# Patient Record
Sex: Female | Born: 1950 | Race: White | Hispanic: No | Marital: Married
Health system: Southern US, Community
[De-identification: ages and names within clinical notes are randomized; demographics above are authoritative.]

---

## 2001-12-26 ENCOUNTER — Other Ambulatory Visit: Admission: RE | Admit: 2001-12-26 | Discharge: 2001-12-26 | Payer: Self-pay | Admitting: Family Medicine

## 2007-06-10 ENCOUNTER — Other Ambulatory Visit: Admission: RE | Admit: 2007-06-10 | Discharge: 2007-06-10 | Payer: Self-pay | Admitting: Obstetrics and Gynecology

## 2009-04-01 ENCOUNTER — Other Ambulatory Visit: Admission: RE | Admit: 2009-04-01 | Discharge: 2009-04-01 | Payer: Self-pay | Admitting: Family Medicine

## 2013-09-05 ENCOUNTER — Other Ambulatory Visit (HOSPITAL_COMMUNITY): Payer: Self-pay | Admitting: Family Medicine

## 2013-09-05 ENCOUNTER — Ambulatory Visit (HOSPITAL_COMMUNITY)
Admission: RE | Admit: 2013-09-05 | Discharge: 2013-09-05 | Disposition: A | Discharge status: Home / Self Care | Payer: BC Managed Care – PPO | Source: Ambulatory Visit | Attending: Family Medicine | Admitting: Family Medicine

## 2013-09-05 DIAGNOSIS — M7989 Other specified soft tissue disorders: Secondary | ICD-10-CM

## 2013-09-05 DIAGNOSIS — M25562 Pain in left knee: Secondary | ICD-10-CM

## 2013-09-05 NOTE — Progress Notes (Addendum)
Left lower extremity venous duplex completed.  Left:  No evidence of DVT, superficial thrombosis, or Baker's cyst.  Right:  Negative for DVT in the common femoral vein.  

## 2013-10-23 ENCOUNTER — Other Ambulatory Visit: Payer: Self-pay | Admitting: Family Medicine

## 2013-10-23 ENCOUNTER — Ambulatory Visit
Admission: RE | Admit: 2013-10-23 | Discharge: 2013-10-23 | Disposition: A | Discharge status: Home / Self Care | Payer: BC Managed Care – PPO | Source: Ambulatory Visit | Attending: Family Medicine | Admitting: Family Medicine

## 2013-10-23 DIAGNOSIS — E785 Hyperlipidemia, unspecified: Secondary | ICD-10-CM

## 2016-06-22 ENCOUNTER — Ambulatory Visit: Payer: Self-pay | Admitting: Podiatry

## 2016-06-25 ENCOUNTER — Encounter: Payer: Self-pay | Admitting: Podiatry

## 2016-06-25 ENCOUNTER — Ambulatory Visit (INDEPENDENT_AMBULATORY_CARE_PROVIDER_SITE_OTHER): Payer: 59 | Admitting: Podiatry

## 2016-06-25 ENCOUNTER — Ambulatory Visit (INDEPENDENT_AMBULATORY_CARE_PROVIDER_SITE_OTHER): Payer: 59

## 2016-06-25 VITALS — BP 142/78 | HR 80 | Resp 16 | Ht 63.0 in | Wt 172.0 lb

## 2016-06-25 DIAGNOSIS — M779 Enthesopathy, unspecified: Secondary | ICD-10-CM | POA: Diagnosis not present

## 2016-06-25 DIAGNOSIS — M79672 Pain in left foot: Secondary | ICD-10-CM

## 2016-06-25 MED ORDER — TRIAMCINOLONE ACETONIDE 10 MG/ML IJ SUSP
10.0000 mg | Freq: Once | INTRAMUSCULAR | Status: AC
  Administered 2016-06-25: 10 mg

## 2016-06-25 NOTE — Progress Notes (Signed)
   Subjective:    Patient ID: Emily Cole, female    DOB: 09/05/1951, 65 y.o.   MRN: [id number]  HPI  Chief Complaint  Patient presents with  . Toe Pain    L 3rd toe pain, swelling and redness x 4 mo.  Pt states "something fell on it like a yr ago."       Review of Systems  All other systems reviewed and are negative.      Objective:   Physical Exam        Assessment & Plan:

## 2016-06-25 NOTE — Progress Notes (Signed)
Subjective:     Patient ID: Emily FriezeMary C Cole, female   DOB: 02/28/1951, 65 y.o.   MRN: [id number]  HPI patient presents third digit left stating was get swollen in the distal portion but now it is hard to wear shoe gear with and she remembers an injury year ago but it's really been bothering her for about 4 months   Review of Systems  All other systems reviewed and are negative.      Objective:   Physical Exam  Constitutional: She is oriented to person, place, and time.  Cardiovascular: Intact distal pulses.   Musculoskeletal: Normal range of motion.  Neurological: She is oriented to person, place, and time.  Skin: Skin is warm.  Nursing note and vitals reviewed.  neurovascular status intact muscle strength adequate range of motion within normal limits with patient found to have discomfort and pain of the distal third digit left with redness and fluid buildup and no other pathology with no history of problems with other joints. 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

## 2018-06-16 ENCOUNTER — Other Ambulatory Visit: Payer: Self-pay | Admitting: Family Medicine

## 2018-06-16 DIAGNOSIS — Z1231 Encounter for screening mammogram for malignant neoplasm of breast: Secondary | ICD-10-CM

## 2018-07-19 ENCOUNTER — Ambulatory Visit
Admission: RE | Admit: 2018-07-19 | Discharge: 2018-07-19 | Disposition: A | Discharge status: Home / Self Care | Payer: 59 | Source: Ambulatory Visit | Attending: Family Medicine | Admitting: Family Medicine

## 2018-07-19 DIAGNOSIS — Z1231 Encounter for screening mammogram for malignant neoplasm of breast: Secondary | ICD-10-CM

## 2018-12-26 IMAGING — MG DIGITAL SCREENING BILATERAL MAMMOGRAM WITH TOMO AND CAD
8 series · 8 of 24 positions shown · non-contrast
Comparison: None.

CLINICAL DATA: Screening.

EXAM:
DIGITAL SCREENING BILATERAL MAMMOGRAM WITH TOMO AND CAD

[L MLO synth-2D]
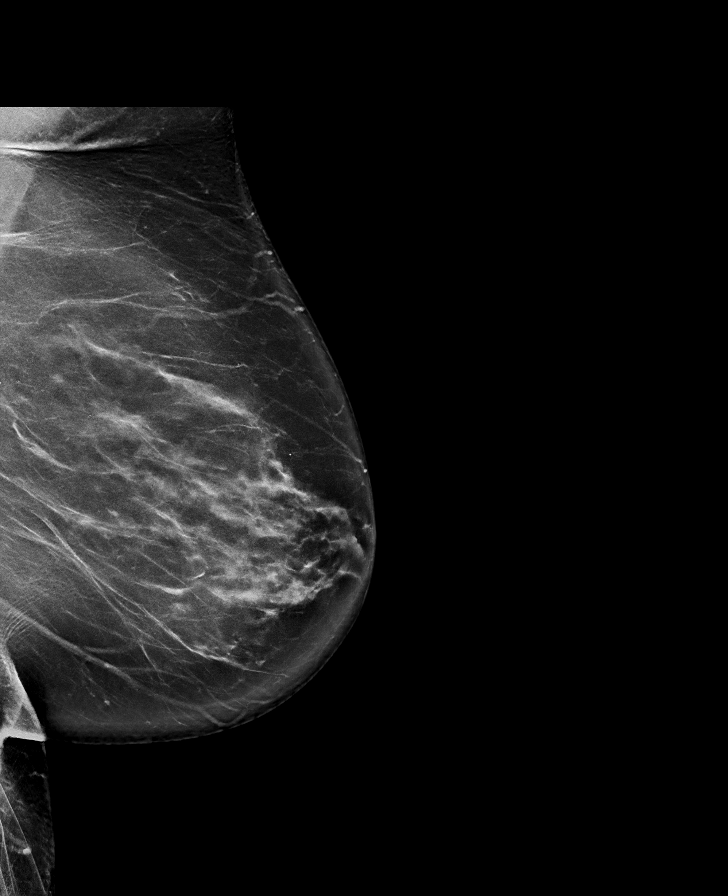

[L CC synth-2D]
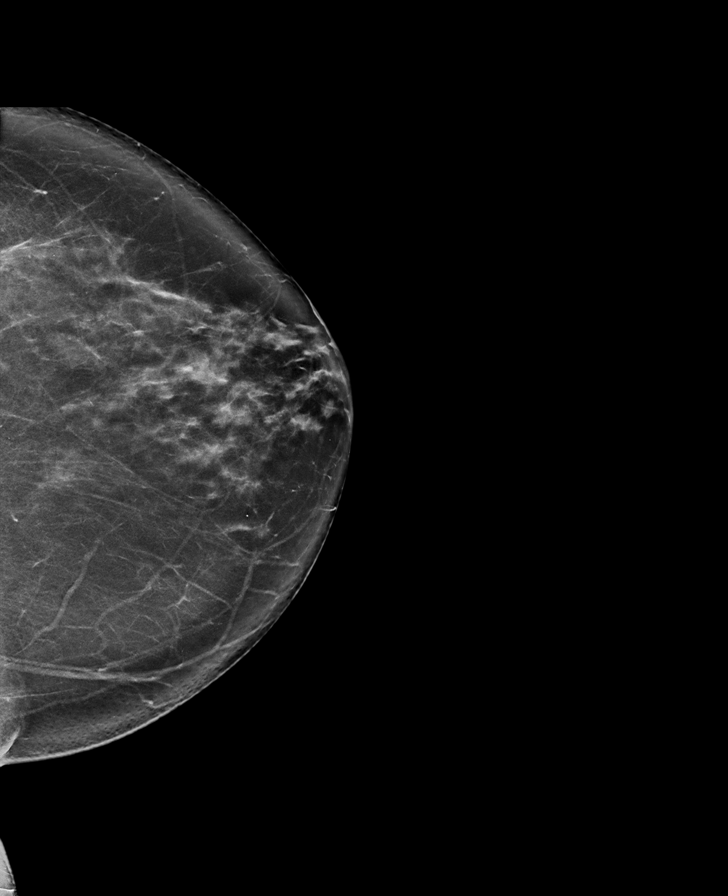

[R CC synth-2D]
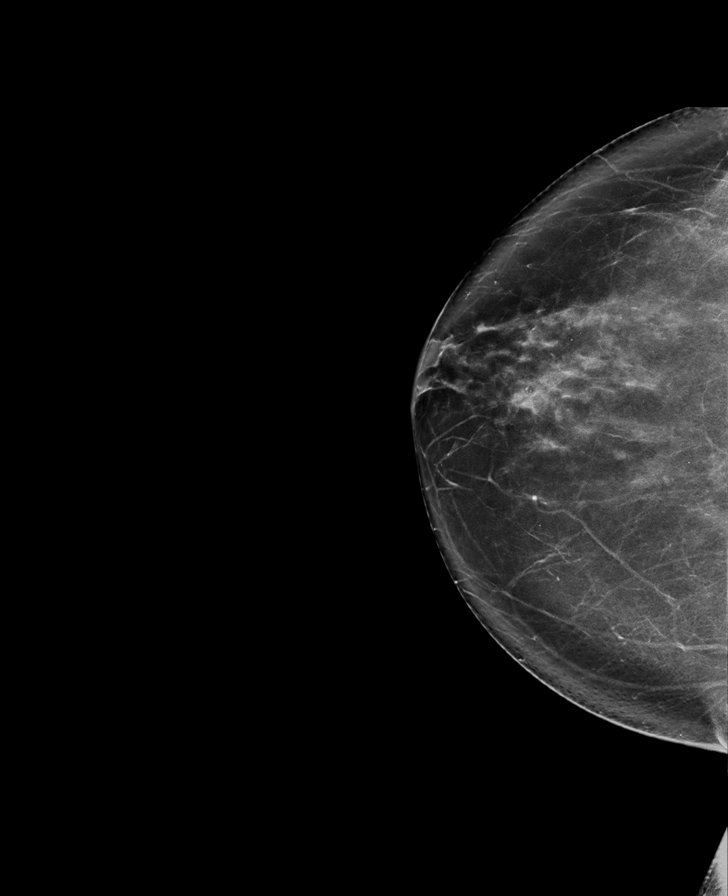

[R MLO synth-2D]
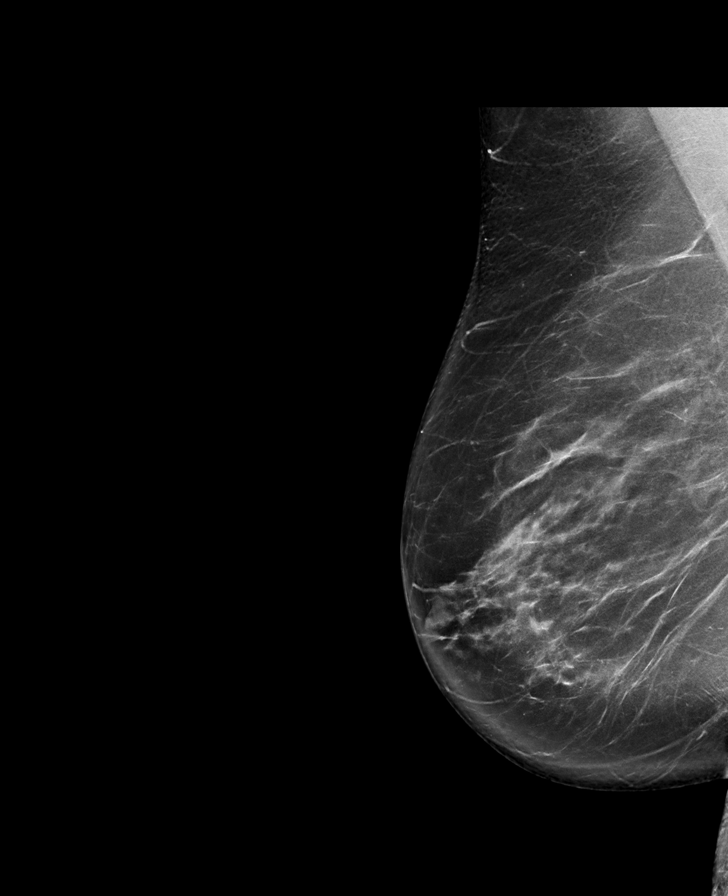

[R MLO tomo · tomo slice 47/93.0]
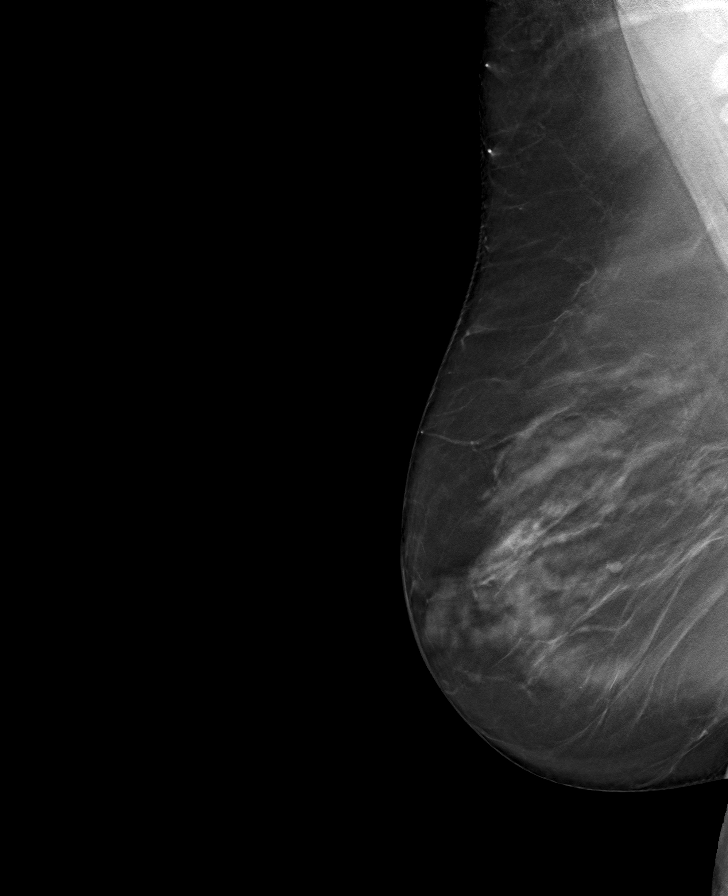

[L MLO tomo · tomo slice 48/95.0]
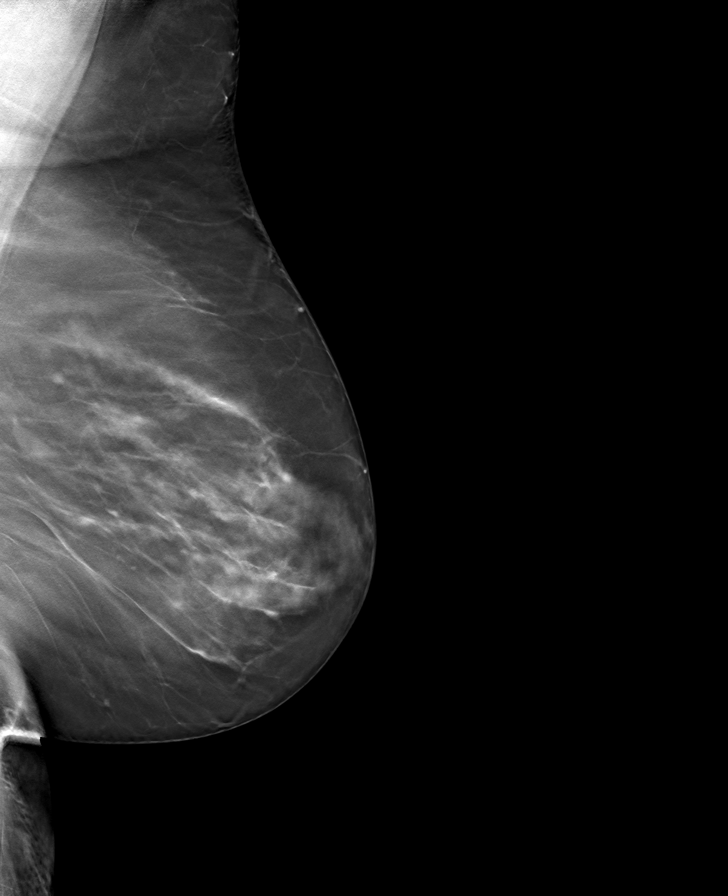

[L CC tomo · tomo slice 43/84.0]
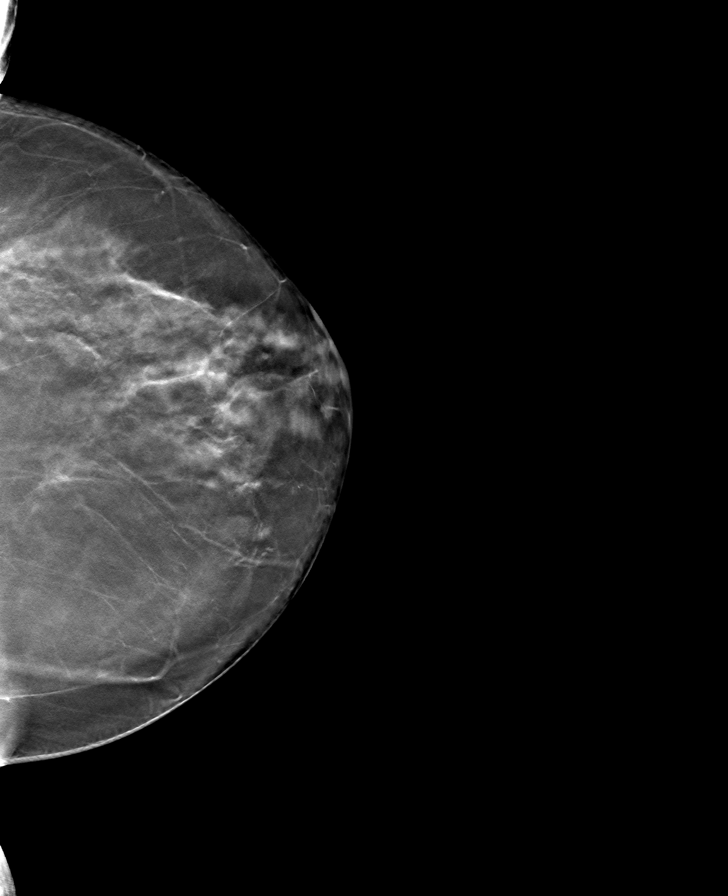

[R CC tomo · tomo slice 43/85.0]
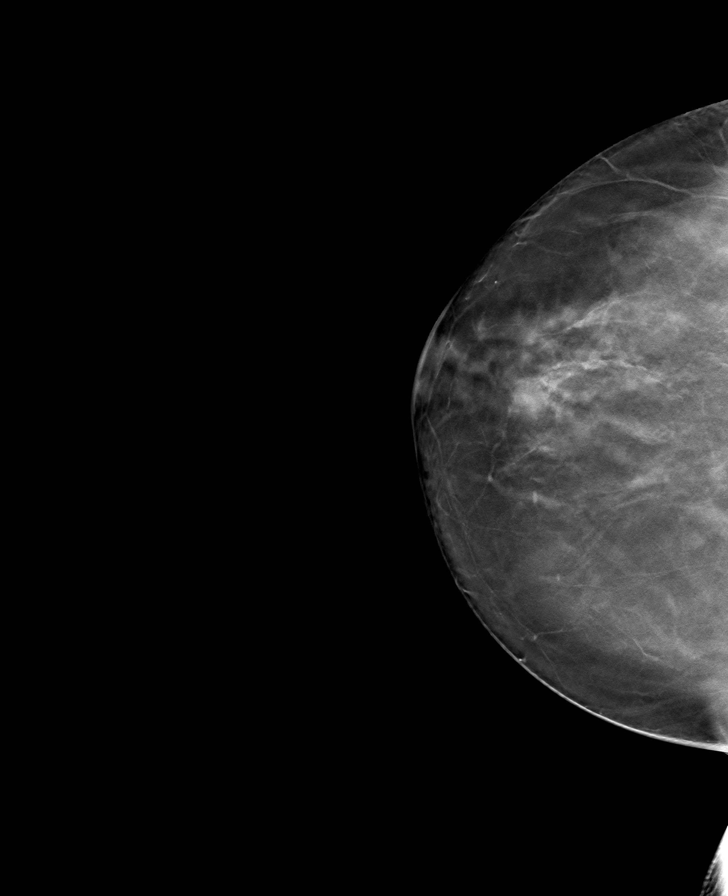

[8 of 24 positions shown; findings below may reference images not displayed]

ACR Breast Density Category c: The breast tissue is heterogeneously
dense, which may obscure small masses
FINDINGS: There are no findings suspicious for malignancy. Images were
processed with CAD.
IMPRESSION: No mammographic evidence of malignancy. A result letter of this
screening mammogram will be mailed directly to the patient.

RECOMMENDATION:
Screening mammogram in one year. (Code:[id number])

BI-RADS CATEGORY  1: Negative.

## 2019-10-10 ENCOUNTER — Telehealth: Payer: Self-pay | Admitting: *Deleted

## 2019-10-10 NOTE — Telephone Encounter (Signed)
Appointment made for COVID vaccine 2/5. Verbalized understanding.

## 2019-10-22 ENCOUNTER — Ambulatory Visit: Payer: 59

## 2019-10-27 ENCOUNTER — Ambulatory Visit: Payer: 59

## 2019-10-29 ENCOUNTER — Ambulatory Visit: Payer: 59 | Attending: Internal Medicine

## 2019-10-29 DIAGNOSIS — Z23 Encounter for immunization: Secondary | ICD-10-CM

## 2019-10-29 NOTE — Progress Notes (Signed)
   Covid-19 Vaccination Clinic  Name:  Emily Cole    MRN: [id number] DOB: 12-28-1950  10/29/2019  [name] was observed post Covid-19 immunization for 15 minutes without incidence. She was provided with Vaccine Information Sheet and instruction to access the V-Safe system.   [name] was instructed to call 911 with any severe reactions post vaccine: Marland Kitchen Difficulty breathing  . Swelling of your face and throat  . A fast heartbeat  . A bad rash all over your body  . Dizziness and weakness    Immunizations Administered    Name Date Dose VIS Date Route   Pfizer COVID-19 Vaccine 10/29/2019 11:07 AM 0.3 mL 09/01/2019 Intramuscular   Manufacturer: [employer], [employer]   Lot: [id number]   NDC: [address]-8

## 2019-11-22 ENCOUNTER — Ambulatory Visit: Payer: 59

## 2019-11-22 ENCOUNTER — Ambulatory Visit: Payer: 59 | Attending: Internal Medicine

## 2019-11-22 DIAGNOSIS — Z23 Encounter for immunization: Secondary | ICD-10-CM | POA: Insufficient documentation

## 2019-11-22 NOTE — Progress Notes (Signed)
   Covid-19 Vaccination Clinic  Name:  Emily Cole    MRN: [id number] DOB: 13-Feb-1951  11/22/2019  [name] was observed post Covid-19 immunization for 15 minutes without incident. She was provided with Vaccine Information Sheet and instruction to access the V-Safe system.   [name] was instructed to call 911 with any severe reactions post vaccine: Marland Kitchen Difficulty breathing  . Swelling of face and throat  . A fast heartbeat  . A bad rash all over body  . Dizziness and weakness   Immunizations Administered    Name Date Dose VIS Date Route   Pfizer COVID-19 Vaccine 11/22/2019 12:11 PM 0.3 mL 09/01/2019 Intramuscular   Manufacturer: [employer], [employer]   Lot: [id number]   NDC: [address]-9

## 2022-08-20 ENCOUNTER — Other Ambulatory Visit: Payer: Self-pay | Admitting: Geriatric Medicine

## 2022-08-20 DIAGNOSIS — Z13820 Encounter for screening for osteoporosis: Secondary | ICD-10-CM

## 2022-09-01 ENCOUNTER — Other Ambulatory Visit: Payer: Self-pay | Admitting: Geriatric Medicine

## 2022-09-01 DIAGNOSIS — Z1231 Encounter for screening mammogram for malignant neoplasm of breast: Secondary | ICD-10-CM

## 2022-10-29 ENCOUNTER — Ambulatory Visit
Admission: RE | Admit: 2022-10-29 | Discharge: 2022-10-29 | Disposition: A | Discharge status: Home / Self Care | Payer: Medicare Other | Source: Ambulatory Visit | Attending: Geriatric Medicine | Admitting: Geriatric Medicine

## 2022-10-29 DIAGNOSIS — Z1231 Encounter for screening mammogram for malignant neoplasm of breast: Secondary | ICD-10-CM

## 2022-10-29 DIAGNOSIS — Z13820 Encounter for screening for osteoporosis: Secondary | ICD-10-CM

## 2023-09-14 ENCOUNTER — Other Ambulatory Visit (HOSPITAL_COMMUNITY): Payer: Self-pay | Admitting: Adult Health

## 2023-09-14 DIAGNOSIS — Z8249 Family history of ischemic heart disease and other diseases of the circulatory system: Secondary | ICD-10-CM

## 2023-12-27 ENCOUNTER — Emergency Department (HOSPITAL_COMMUNITY)

## 2023-12-27 ENCOUNTER — Encounter (HOSPITAL_COMMUNITY): Payer: Self-pay | Admitting: Emergency Medicine

## 2023-12-27 ENCOUNTER — Emergency Department (HOSPITAL_COMMUNITY)
Admission: EM | Admit: 2023-12-27 | Discharge: 2023-12-27 | Disposition: A | Discharge status: Home / Self Care | Attending: Emergency Medicine | Admitting: Emergency Medicine

## 2023-12-27 ENCOUNTER — Other Ambulatory Visit: Payer: Self-pay

## 2023-12-27 DIAGNOSIS — Y9301 Activity, walking, marching and hiking: Secondary | ICD-10-CM | POA: Diagnosis not present

## 2023-12-27 DIAGNOSIS — R519 Headache, unspecified: Secondary | ICD-10-CM | POA: Diagnosis present

## 2023-12-27 DIAGNOSIS — S0003XA Contusion of scalp, initial encounter: Secondary | ICD-10-CM | POA: Diagnosis not present

## 2023-12-27 DIAGNOSIS — W108XXA Fall (on) (from) other stairs and steps, initial encounter: Secondary | ICD-10-CM | POA: Diagnosis not present

## 2023-12-27 DIAGNOSIS — S0990XA Unspecified injury of head, initial encounter: Secondary | ICD-10-CM

## 2023-12-27 MED ORDER — ACETAMINOPHEN 500 MG PO TABS
1000.0000 mg | ORAL_TABLET | Freq: Once | ORAL | Status: AC
  Administered 2023-12-27: 1000 mg via ORAL
  Filled 2023-12-27: qty 2

## 2023-12-27 NOTE — ED Provider Notes (Signed)
 Assume Care Note  Physical Exam  BP (!) 170/80   Pulse 83   Temp 98.3 F (36.8 C) (Oral)   Resp 18   Ht 5\' 3"  (1.6 m)   Wt 78.5 kg   SpO2 99%   BMI 30.65 kg/m   Physical Exam  Procedures  Procedures  ED Course / MDM   Clinical Course as of 12/27/23 1512  Mon Dec 27, 2023  1507 S- mechanical fall on stairs, no LOC or thinners [ ]  CT and go [AO]    Clinical Course User Index [AO] Lorain Robson, MD   Medical Decision Making Amount and/or Complexity of Data Reviewed Radiology: ordered.  Risk OTC drugs.   I reevaluated the patient shortly after handoff.  She endorses mild improvement in her headache, and does not have any focal neurologic deficits on my exam.  CT imaging resulted with no acute intracranial findings, degenerative changes noted to the C-spine but but no acute fractures or malalignments.  Encourage patient to follow-up with her PCP in the next week for reevaluation.  Discussed taking Tylenol  as needed for pain control.  Strict return precautions were given, and patient was discharged in stable condition.       Lorain Robson, MD 12/27/23 2025    Guadalupe Lee, MD 01/12/24 1057

## 2023-12-27 NOTE — ED Triage Notes (Signed)
 Pt was walking down steps with basket of laundry and missed one step and fell backwards and struck back of head. Denies LOC no on blood thinners. Knot on back of head. Denies any dizziness or lightheadedness.

## 2023-12-27 NOTE — Discharge Instructions (Signed)
 You were seen for evaluation after a fall. CT imaging of your head and neck did not demonstrate any acute findings. Of note, you were noted to have degenerative disc disease C4-C5 through C6-C7.  You can take Tylenol and Motrin as needed for pain control.  Make sure to follow-up with your PCP within the next week for reevaluation.  Please return to the ED if you experience any worsening headaches, confusion, weakness to one side of your body, or of any other reason to seek emergency medical care.

## 2023-12-27 NOTE — ED Provider Notes (Signed)
  EMERGENCY DEPARTMENT AT [facility] Provider Note   CSN: [id number] Arrival date & time: 12/27/23  1132     History  Chief Complaint  Patient presents with   Head Injury   Fall    Emily Cole is a 73 y.o. female who presents to ED after mechanical fall.  Patient stated that she was walking downstairs when she slipped and fell backwards, hitting the back of her head on the stairs.  Patient denies LOC, seizures, blood thinners.  Denies vision changes.  Patient endorsing some pain on the back of her head where she has a hematoma.  Denies any other complaints today.   Head Injury Fall       Home Medications Prior to Admission medications   Not on File      Allergies    Penicillins    Review of Systems   Review of Systems  Neurological:        Mechanical fall    Physical Exam Updated Vital Signs BP (!) 170/80   Pulse 83   Temp 98.3 F (36.8 C) (Oral)   Resp 18   Ht 5\' 3"  (1.6 m)   Wt 78.5 kg   SpO2 99%   BMI 30.65 kg/m  Physical Exam Vitals and nursing note reviewed.  Constitutional:      General: She is not in acute distress.    Appearance: She is not ill-appearing or toxic-appearing.  HENT:     Head: Normocephalic and atraumatic.     Comments: Hematoma and abrasion on posterior scalp. No active bleeding or laceration. Eyes:     General: No scleral icterus.       Right eye: No discharge.        Left eye: No discharge.     Conjunctiva/sclera: Conjunctivae normal.  Cardiovascular:     Rate and Rhythm: Normal rate.  Pulmonary:     Effort: Pulmonary effort is normal.  Abdominal:     General: Abdomen is flat.  Skin:    General: Skin is warm and dry.  Neurological:     General: No focal deficit present.     Mental Status: She is alert. Mental status is at baseline.     Comments: GCS 15. Speech is goal oriented. No deficits appreciated to CN III-XII; symmetric eyebrow raise, no facial drooping, tongue midline. Patient has  equal grip strength bilaterally with 5/5 strength against resistance in all major muscle groups bilaterally. Sensation to light touch intact. Patient moves extremities without ataxia. Patient ambulatory with steady gait.   Psychiatric:        Mood and Affect: Mood normal.        Behavior: Behavior normal.     ED Results / Procedures / Treatments   Labs (all labs ordered are listed, but only abnormal results are displayed) Labs Reviewed - No data to display  EKG None  Radiology No results found.  Procedures Procedures    Medications Ordered in ED Medications - No data to display  ED Course/ Medical Decision Making/ A&P                                 Medical Decision Making Amount and/or Complexity of Data Reviewed Radiology: ordered.  Risk OTC drugs.   This patient presents to the ED after a fall, this involves an extensive number of treatment options, and is a complaint that carries with it a high  risk of complications and morbidity.  The differential diagnosis includes  intracranial hemorrhage, subdural/epidural hematoma, vertebral fracture, spinal cord injury, muscle strain, skull fracture, fracture.   Co morbidities that complicate the patient evaluation  none   Additional history obtained:  Dr. Wynelle Link PCP    Problem List / ED Course / Critical interventions / Medication management  Patient presented for Mechanical fall. Patient with stable vitals and does not appear to be in distress. Denies LOC, blood thinners, seizures, vision changes. Physical/neuro exam unremarkable. I ordered imaging studies including CT head/cervical spine . These imaging studies are pending. I have reviewed the patients home medicines and have made adjustments as needed   Social Determinants of Health:  geriatric  3PM Care of Emily Cole transferred to and Dr. Dwain Sarna at the end of my shift as the patient will require reassessment once labs/imaging have resulted. Patient  presentation, ED course, and plan of care discussed with review of all pertinent labs and imaging. Please see his/her note for further details regarding further ED course and disposition. Plan at time of handoff is reassess patient after CT imaging. This may be altered or completely changed at the discretion of the oncoming team pending results of further workup.         Final Clinical Impression(s) / ED Diagnoses Final diagnoses:  None    Rx / DC Orders ED Discharge Orders     None         Dorthy Cooler, [place] 12/27/23 1508    Tegeler, Canary Brim, MD 12/27/23 [id number]
